# Patient Record
Sex: Female | Born: 1955 | Race: White | Hispanic: No | Marital: Married | Smoking: Never smoker
Health system: Southern US, Community
[De-identification: ages and names within clinical notes are randomized; demographics above are authoritative.]

## PROBLEM LIST (undated history)

## (undated) DIAGNOSIS — I10 Essential (primary) hypertension: Secondary | ICD-10-CM

## (undated) HISTORY — PX: CHOLECYSTECTOMY: SHX55

## (undated) HISTORY — DX: Essential (primary) hypertension: I10

## (undated) HISTORY — PX: NEPHRECTOMY: SHX65

## (undated) HISTORY — PX: LUMBAR LAMINECTOMY: SHX95

## (undated) HISTORY — PX: TONSILLECTOMY AND ADENOIDECTOMY: SUR1326

## (undated) HISTORY — PX: CARDIAC ELECTROPHYSIOLOGY STUDY AND ABLATION: SHX1294

## (undated) HISTORY — PX: TUBAL LIGATION: SHX77

---

## 2010-09-01 ENCOUNTER — Other Ambulatory Visit: Payer: Self-pay | Admitting: Family Medicine

## 2010-09-01 DIAGNOSIS — Z780 Asymptomatic menopausal state: Secondary | ICD-10-CM

## 2010-09-30 ENCOUNTER — Ambulatory Visit: Payer: Self-pay

## 2010-09-30 ENCOUNTER — Other Ambulatory Visit: Payer: Self-pay

## 2010-10-27 ENCOUNTER — Ambulatory Visit
Admission: RE | Admit: 2010-10-27 | Discharge: 2010-10-27 | Disposition: A | Discharge status: Home / Self Care | Payer: 59 | Source: Ambulatory Visit | Attending: Family Medicine | Admitting: Family Medicine

## 2010-10-27 DIAGNOSIS — Z780 Asymptomatic menopausal state: Secondary | ICD-10-CM

## 2010-11-03 ENCOUNTER — Other Ambulatory Visit: Payer: Self-pay | Admitting: Family Medicine

## 2010-11-03 DIAGNOSIS — R928 Other abnormal and inconclusive findings on diagnostic imaging of breast: Secondary | ICD-10-CM

## 2010-11-07 ENCOUNTER — Ambulatory Visit
Admission: RE | Admit: 2010-11-07 | Discharge: 2010-11-07 | Disposition: A | Discharge status: Home / Self Care | Payer: 59 | Source: Ambulatory Visit | Attending: Family Medicine | Admitting: Family Medicine

## 2010-11-07 DIAGNOSIS — R928 Other abnormal and inconclusive findings on diagnostic imaging of breast: Secondary | ICD-10-CM

## 2012-07-03 ENCOUNTER — Other Ambulatory Visit: Payer: Self-pay | Admitting: Family Medicine

## 2012-07-03 DIAGNOSIS — Z1231 Encounter for screening mammogram for malignant neoplasm of breast: Secondary | ICD-10-CM

## 2012-08-06 ENCOUNTER — Ambulatory Visit
Admission: RE | Admit: 2012-08-06 | Discharge: 2012-08-06 | Disposition: A | Discharge status: Home / Self Care | Payer: BC Managed Care – PPO | Source: Ambulatory Visit | Attending: Family Medicine | Admitting: Family Medicine

## 2012-08-06 DIAGNOSIS — Z1231 Encounter for screening mammogram for malignant neoplasm of breast: Secondary | ICD-10-CM

## 2013-12-02 ENCOUNTER — Other Ambulatory Visit: Payer: Self-pay | Admitting: *Deleted

## 2013-12-02 DIAGNOSIS — N6489 Other specified disorders of breast: Secondary | ICD-10-CM

## 2013-12-24 ENCOUNTER — Other Ambulatory Visit: Payer: Self-pay | Admitting: Family Medicine

## 2013-12-24 DIAGNOSIS — N6489 Other specified disorders of breast: Secondary | ICD-10-CM

## 2013-12-26 ENCOUNTER — Ambulatory Visit
Admission: RE | Admit: 2013-12-26 | Discharge: 2013-12-26 | Disposition: A | Discharge status: Home / Self Care | Payer: BC Managed Care – PPO | Source: Ambulatory Visit | Attending: *Deleted | Admitting: *Deleted

## 2013-12-26 DIAGNOSIS — N6489 Other specified disorders of breast: Secondary | ICD-10-CM

## 2015-01-22 ENCOUNTER — Other Ambulatory Visit: Payer: Self-pay

## 2015-01-22 DIAGNOSIS — Z1231 Encounter for screening mammogram for malignant neoplasm of breast: Secondary | ICD-10-CM

## 2015-02-12 ENCOUNTER — Ambulatory Visit: Payer: Self-pay

## 2015-03-03 ENCOUNTER — Ambulatory Visit: Payer: Self-pay

## 2015-04-07 ENCOUNTER — Ambulatory Visit
Admission: RE | Admit: 2015-04-07 | Discharge: 2015-04-07 | Disposition: A | Discharge status: Home / Self Care | Payer: BLUE CROSS/BLUE SHIELD | Source: Ambulatory Visit

## 2015-04-07 DIAGNOSIS — Z1231 Encounter for screening mammogram for malignant neoplasm of breast: Secondary | ICD-10-CM

## 2016-01-20 ENCOUNTER — Other Ambulatory Visit: Payer: Self-pay | Admitting: Family Medicine

## 2016-01-20 DIAGNOSIS — Z1231 Encounter for screening mammogram for malignant neoplasm of breast: Secondary | ICD-10-CM

## 2016-04-07 ENCOUNTER — Ambulatory Visit
Admission: RE | Admit: 2016-04-07 | Discharge: 2016-04-07 | Disposition: A | Discharge status: Home / Self Care | Payer: BLUE CROSS/BLUE SHIELD | Source: Ambulatory Visit | Attending: Family Medicine | Admitting: Family Medicine

## 2016-04-07 DIAGNOSIS — Z1231 Encounter for screening mammogram for malignant neoplasm of breast: Secondary | ICD-10-CM

## 2017-06-08 ENCOUNTER — Other Ambulatory Visit: Payer: Self-pay | Admitting: Family Medicine

## 2017-06-08 DIAGNOSIS — Z139 Encounter for screening, unspecified: Secondary | ICD-10-CM

## 2017-06-27 ENCOUNTER — Ambulatory Visit
Admission: RE | Admit: 2017-06-27 | Discharge: 2017-06-27 | Disposition: A | Discharge status: Home / Self Care | Payer: BLUE CROSS/BLUE SHIELD | Source: Ambulatory Visit | Attending: Family Medicine | Admitting: Family Medicine

## 2017-06-27 DIAGNOSIS — Z139 Encounter for screening, unspecified: Secondary | ICD-10-CM

## 2018-06-20 ENCOUNTER — Other Ambulatory Visit: Payer: Self-pay | Admitting: Family Medicine

## 2018-06-20 DIAGNOSIS — Z1231 Encounter for screening mammogram for malignant neoplasm of breast: Secondary | ICD-10-CM

## 2018-07-12 ENCOUNTER — Inpatient Hospital Stay: Admission: RE | Admit: 2018-07-12 | Payer: BLUE CROSS/BLUE SHIELD | Source: Ambulatory Visit

## 2018-08-28 ENCOUNTER — Ambulatory Visit: Payer: BLUE CROSS/BLUE SHIELD

## 2018-10-08 ENCOUNTER — Ambulatory Visit
Admission: RE | Admit: 2018-10-08 | Discharge: 2018-10-08 | Disposition: A | Discharge status: Home / Self Care | Payer: BC Managed Care – PPO | Source: Ambulatory Visit | Attending: Family Medicine | Admitting: Family Medicine

## 2018-10-08 ENCOUNTER — Other Ambulatory Visit: Payer: Self-pay

## 2018-10-08 DIAGNOSIS — Z1231 Encounter for screening mammogram for malignant neoplasm of breast: Secondary | ICD-10-CM

## 2019-06-16 ENCOUNTER — Other Ambulatory Visit: Payer: Self-pay | Admitting: Oncology

## 2019-06-16 DIAGNOSIS — R2232 Localized swelling, mass and lump, left upper limb: Secondary | ICD-10-CM

## 2019-06-30 ENCOUNTER — Ambulatory Visit
Admission: RE | Admit: 2019-06-30 | Discharge: 2019-06-30 | Disposition: A | Discharge status: Home / Self Care | Payer: BC Managed Care – PPO | Source: Ambulatory Visit | Attending: Oncology | Admitting: Oncology

## 2019-06-30 ENCOUNTER — Other Ambulatory Visit: Payer: Self-pay

## 2019-06-30 DIAGNOSIS — R2232 Localized swelling, mass and lump, left upper limb: Secondary | ICD-10-CM

## 2019-11-07 ENCOUNTER — Other Ambulatory Visit: Payer: Self-pay | Admitting: Physician Assistant

## 2019-11-07 DIAGNOSIS — Z1231 Encounter for screening mammogram for malignant neoplasm of breast: Secondary | ICD-10-CM

## 2019-11-27 ENCOUNTER — Ambulatory Visit
Admission: RE | Admit: 2019-11-27 | Discharge: 2019-11-27 | Disposition: A | Discharge status: Home / Self Care | Payer: BC Managed Care – PPO | Source: Ambulatory Visit | Attending: Physician Assistant | Admitting: Physician Assistant

## 2019-11-27 ENCOUNTER — Other Ambulatory Visit: Payer: Self-pay

## 2019-11-27 DIAGNOSIS — Z1231 Encounter for screening mammogram for malignant neoplasm of breast: Secondary | ICD-10-CM

## 2021-02-04 ENCOUNTER — Other Ambulatory Visit: Payer: Self-pay | Admitting: Physician Assistant

## 2021-02-04 DIAGNOSIS — Z1231 Encounter for screening mammogram for malignant neoplasm of breast: Secondary | ICD-10-CM

## 2021-03-11 ENCOUNTER — Other Ambulatory Visit: Payer: Self-pay

## 2021-03-11 ENCOUNTER — Ambulatory Visit
Admission: RE | Admit: 2021-03-11 | Discharge: 2021-03-11 | Disposition: A | Discharge status: Home / Self Care | Payer: Medicare Other | Source: Ambulatory Visit | Attending: Physician Assistant | Admitting: Physician Assistant

## 2021-03-11 DIAGNOSIS — Z1231 Encounter for screening mammogram for malignant neoplasm of breast: Secondary | ICD-10-CM

## 2021-11-24 IMAGING — MG MM DIGITAL SCREENING BILAT W/ TOMO AND CAD
8 series · 8 of 24 positions shown · non-contrast
Comparison: Previous exam(s).

CLINICAL DATA: Screening.

EXAM:
DIGITAL SCREENING BILATERAL MAMMOGRAM WITH TOMOSYNTHESIS AND CAD
TECHNIQUE: Bilateral screening digital craniocaudal and mediolateral oblique
mammograms were obtained. Bilateral screening digital breast
tomosynthesis was performed. The images were evaluated with
computer-aided detection.

[L MLO synth-2D]
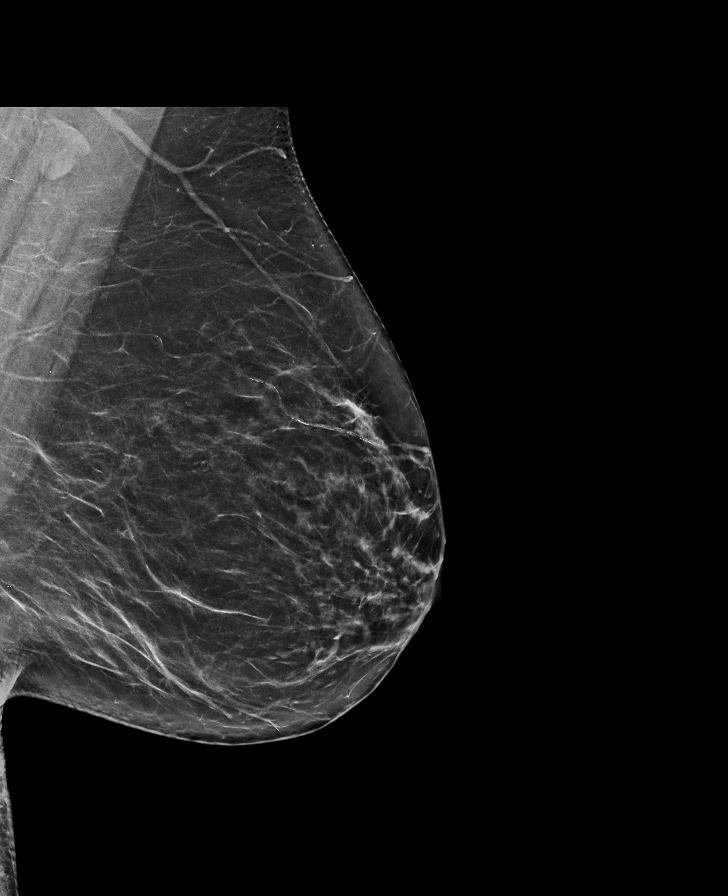

[R MLO synth-2D]
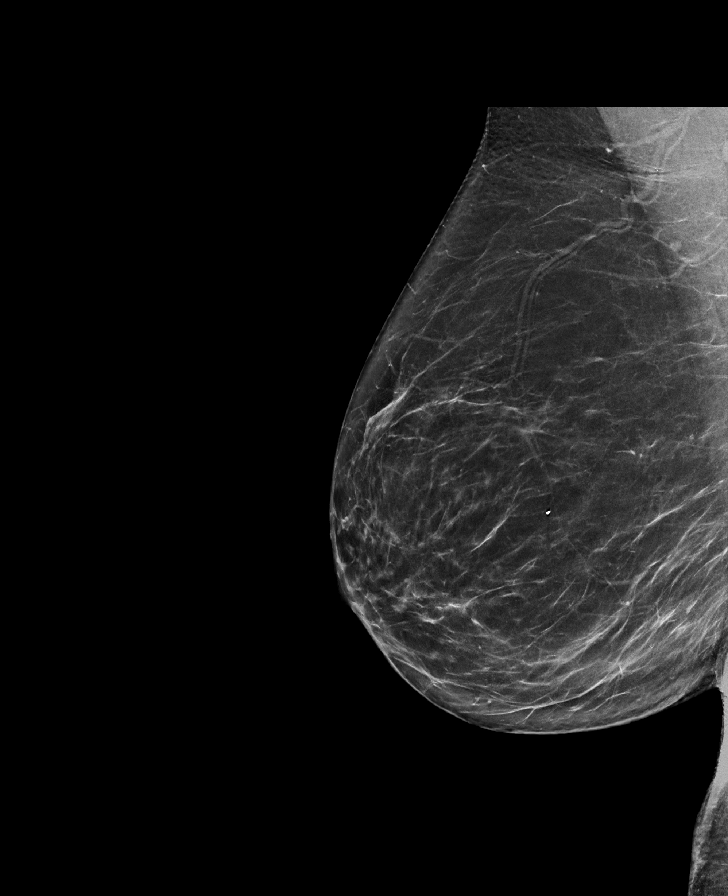

[R CC synth-2D]
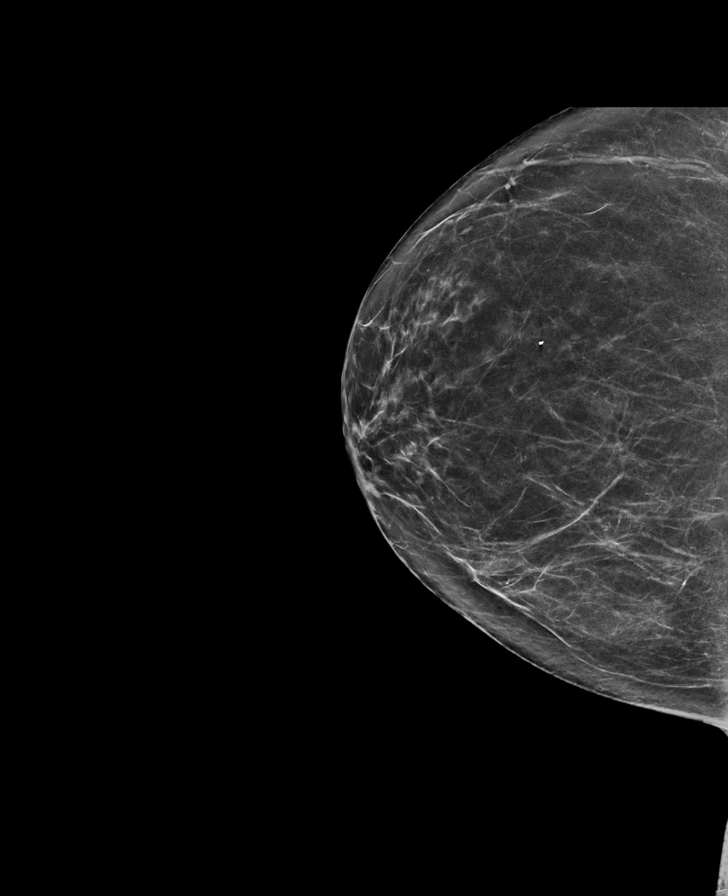

[L CC synth-2D]
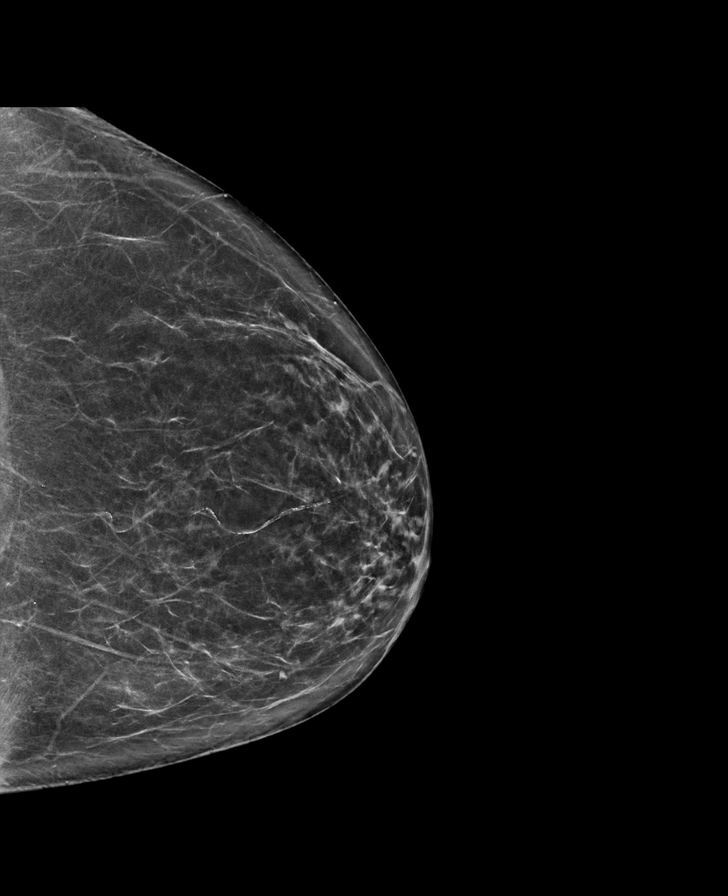

[L CC tomo · tomo slice 37/74.0]
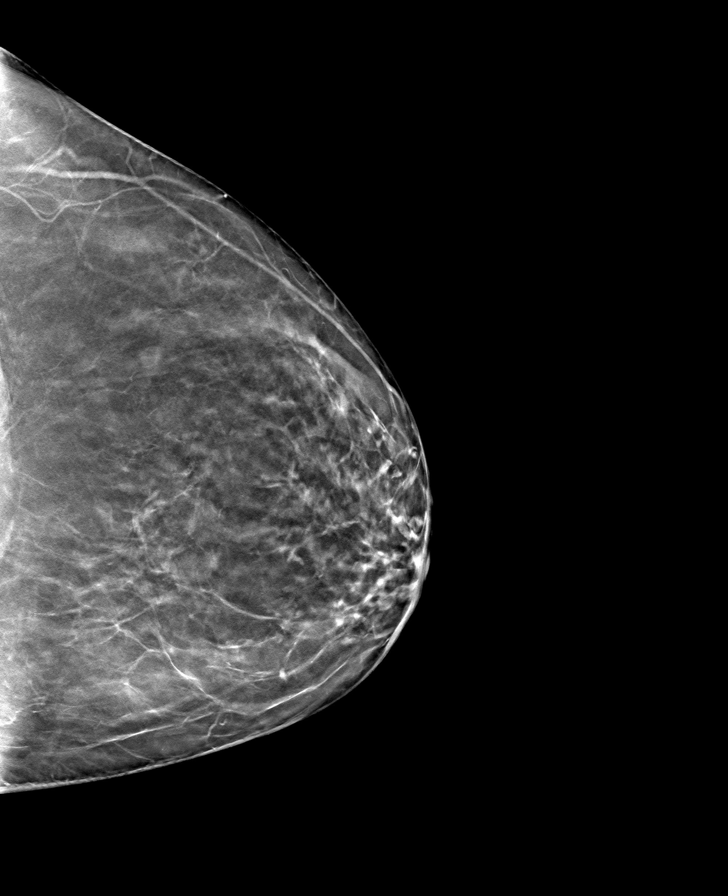

[L MLO tomo · tomo slice 39/76.0]
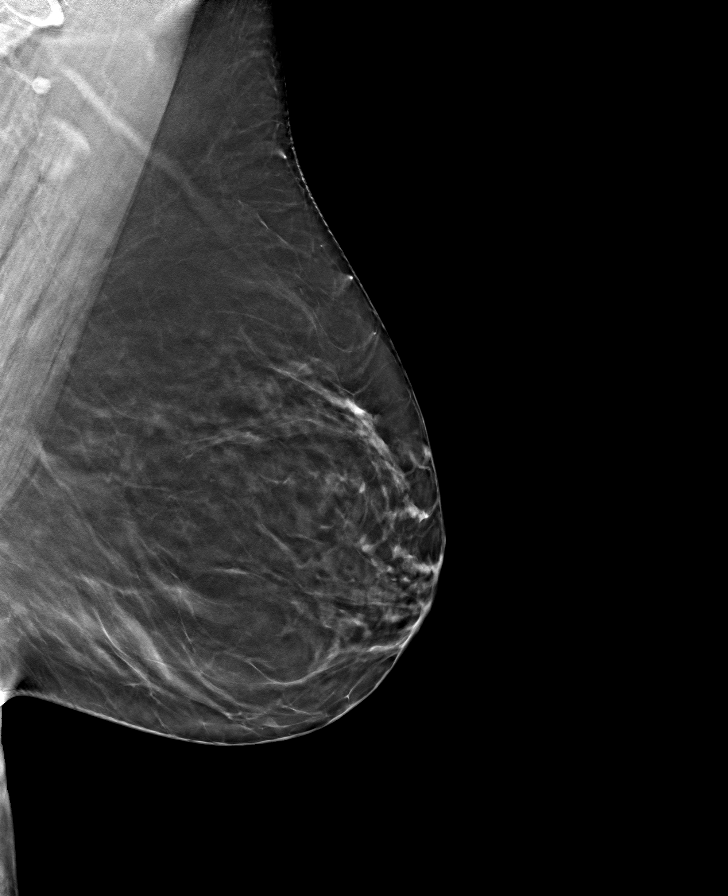

[R CC tomo · tomo slice 34/67.0]
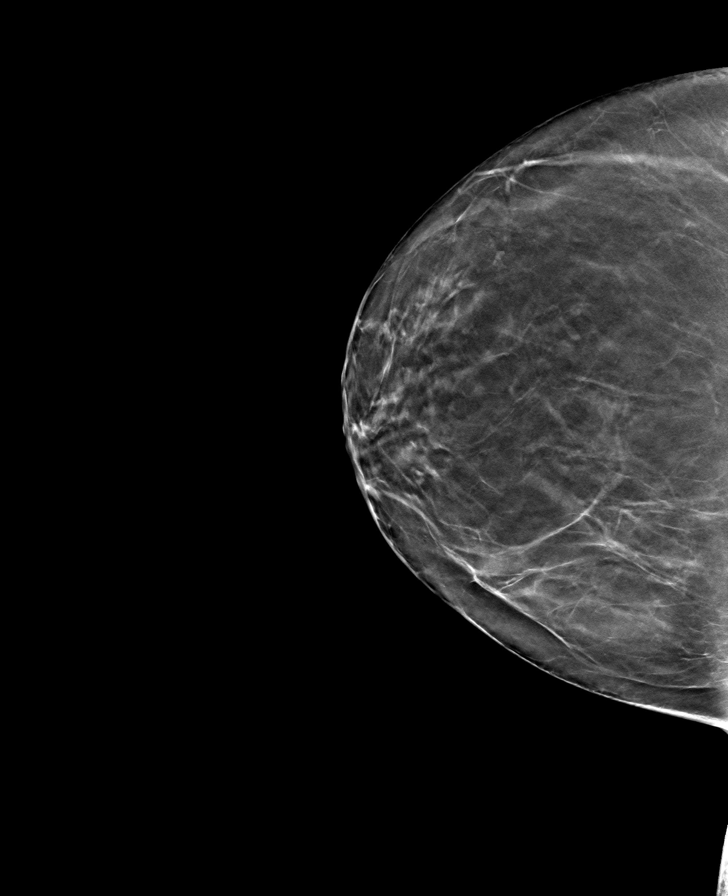

[R MLO tomo · tomo slice 39/76.0]
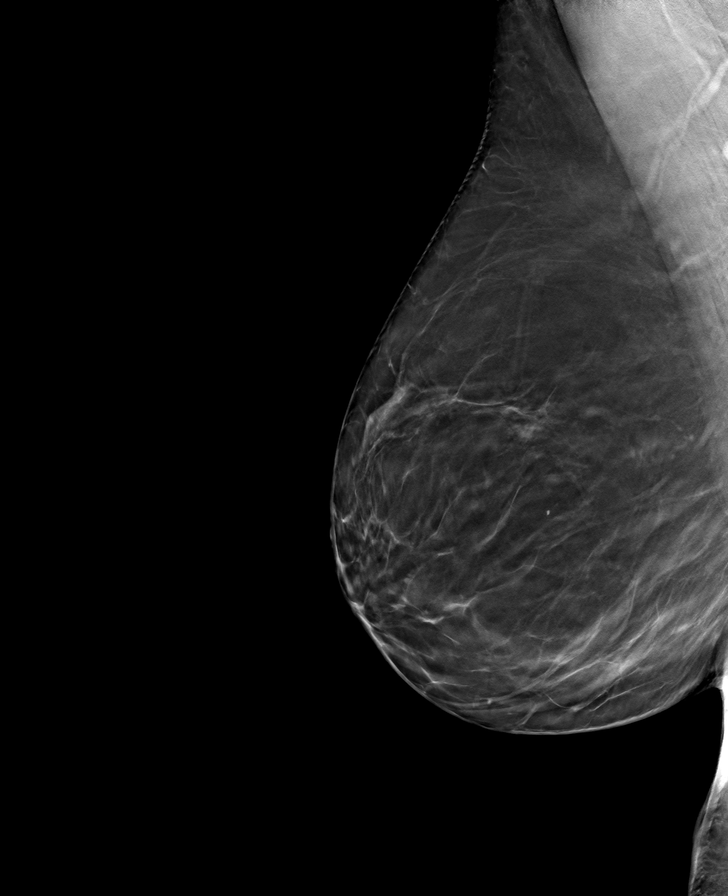

[8 of 24 positions shown; findings below may reference images not displayed]

ACR Breast Density Category b: There are scattered areas of
fibroglandular density.
FINDINGS: There are no findings suspicious for malignancy.
IMPRESSION: No mammographic evidence of malignancy. A result letter of this
screening mammogram will be mailed directly to the patient.

RECOMMENDATION:
Screening mammogram in one year. (Code:51-O-LD2)

BI-RADS CATEGORY  1: Negative.

## 2022-02-03 ENCOUNTER — Other Ambulatory Visit: Payer: Self-pay | Admitting: Physician Assistant

## 2022-02-03 DIAGNOSIS — Z1231 Encounter for screening mammogram for malignant neoplasm of breast: Secondary | ICD-10-CM

## 2022-03-24 ENCOUNTER — Ambulatory Visit: Payer: Medicare Other

## 2022-05-05 ENCOUNTER — Ambulatory Visit
Admission: RE | Admit: 2022-05-05 | Discharge: 2022-05-05 | Disposition: A | Discharge status: Home / Self Care | Payer: BC Managed Care – PPO | Source: Ambulatory Visit | Attending: Physician Assistant | Admitting: Physician Assistant

## 2022-05-05 DIAGNOSIS — Z1231 Encounter for screening mammogram for malignant neoplasm of breast: Secondary | ICD-10-CM

## 2023-05-11 ENCOUNTER — Other Ambulatory Visit: Payer: Self-pay | Admitting: Physician Assistant

## 2023-05-11 DIAGNOSIS — Z1231 Encounter for screening mammogram for malignant neoplasm of breast: Secondary | ICD-10-CM

## 2023-05-25 ENCOUNTER — Ambulatory Visit
Admission: RE | Admit: 2023-05-25 | Discharge: 2023-05-25 | Disposition: A | Discharge status: Home / Self Care | Payer: BC Managed Care – PPO | Source: Ambulatory Visit | Attending: Physician Assistant | Admitting: Physician Assistant

## 2023-05-25 DIAGNOSIS — Z1231 Encounter for screening mammogram for malignant neoplasm of breast: Secondary | ICD-10-CM

## 2024-02-19 ENCOUNTER — Other Ambulatory Visit: Payer: Self-pay | Admitting: Vascular Surgery

## 2024-02-19 DIAGNOSIS — M7989 Other specified soft tissue disorders: Secondary | ICD-10-CM

## 2024-03-26 NOTE — Progress Notes (Signed)
 Patient ID: BELINDA SCHLICHTING, female   DOB: Jan 06, 1956, 68 y.o.   MRN: [id number]  Reason for Consult: No chief complaint on file.   Referred by Allen Lauraine CROME, PA-C  Subjective:     HPI  JAKARA BLATTER is a 68 y.o. female who presents for evaluation of lower extremity pain with pressure.  She denies significant swelling or varicosities.  She recently restarted working as a merchandiser, retail.  She is not on that as a career and was on her feet quite a bit.  She denies any significant heaviness, aching or weakness.  She denies any previous wounds.  She denies any previous blood clots.  She has been wearing compression for many years.  No past medical history on file. Family History  Problem Relation Age of Onset   Breast cancer Mother 38   No past surgical history on file.  Short Social History:  Social History   Tobacco Use   Smoking status: Not on file   Smokeless tobacco: Not on file  Substance Use Topics   Alcohol use: Not on file    No Known Allergies  No current outpatient medications on file.   No current facility-administered medications for this visit.    REVIEW OF SYSTEMS All other systems were reviewed and are negative    Objective:  Objective   There were no vitals filed for this visit. There is no height or weight on file to calculate BMI.  Physical Exam General: no acute distress Cardiac: hemodynamically stable Extremities: Minimal swelling bilaterally.  A few scattered telangiectasias.  Some extra soft tissue on the medial aspect below the knee bilaterally.  No significant tenderness to palpation Vascular:   Right: palpable DP, PT  Left: palpable DP, PT   Data: Reflux study Venous Reflux Times  +--------------+---------+------+-----------+------------+-------------+  RIGHT        Reflux NoRefluxReflux TimeDiameter cmsComments                               Yes                                         +--------------+---------+------+-----------+------------+-------------+  CFV          no                                                   +--------------+---------+------+-----------+------------+-------------+  FV mid        no                                                   +--------------+---------+------+-----------+------------+-------------+  Popliteal    no                                                   +--------------+---------+------+-----------+------------+-------------+  GSV at [facility]    no  0.43                   +--------------+---------+------+-----------+------------+-------------+  GSV prox thighno                            0.28                   +--------------+---------+------+-----------+------------+-------------+  GSV mid thigh no                            0.29                   +--------------+---------+------+-----------+------------+-------------+  GSV dist thighno                            0.30    out of fascia  +--------------+---------+------+-----------+------------+-------------+  GSV at knee   no                            0.20    out of fascia  +--------------+---------+------+-----------+------------+-------------+  SSV at [facility]    no                            0.21                   +--------------+---------+------+-----------+------------+-------------+       Assessment/Plan:     LORENDA GRECCO is a 68 y.o. female with bilateral lower extremity pain.  I explained that our reflux study did not pick up any significant reflux in her right leg today.  She does have very mild signs of venous insufficiency with a few clusters of telangiectasias and very mild swelling towards the end of the day but denies any other significant symptoms. I explained the foundation of CVI treatment of compression and elevation I recommended continued medical grade graduated compression  stockings, intermittent leg elevation and exercise  Follow up PRN      Norman GORMAN Serve MD [facility]

## 2024-03-28 ENCOUNTER — Encounter: Payer: Self-pay | Admitting: Vascular Surgery

## 2024-03-28 ENCOUNTER — Ambulatory Visit (HOSPITAL_COMMUNITY)
Admission: RE | Admit: 2024-03-28 | Discharge: 2024-03-28 | Disposition: A | Source: Ambulatory Visit | Attending: Vascular Surgery

## 2024-03-28 ENCOUNTER — Ambulatory Visit: Admitting: Vascular Surgery

## 2024-03-28 VITALS — BP 143/79 | HR 56 | Temp 98.4°F | Resp 20 | Ht 66.0 in | Wt 209.6 lb

## 2024-03-28 DIAGNOSIS — M7989 Other specified soft tissue disorders: Secondary | ICD-10-CM

## 2024-05-30 ENCOUNTER — Other Ambulatory Visit: Payer: Self-pay | Admitting: Physician Assistant

## 2024-05-30 DIAGNOSIS — Z1231 Encounter for screening mammogram for malignant neoplasm of breast: Secondary | ICD-10-CM

## 2024-06-25 ENCOUNTER — Ambulatory Visit
